# Patient Record
Sex: Male | Born: 1945 | Race: White | Hispanic: No | Marital: Married | Smoking: Never smoker
Health system: Southern US, Community
[De-identification: ages and names within clinical notes are randomized; demographics above are authoritative.]

## PROBLEM LIST (undated history)

## (undated) DIAGNOSIS — C61 Malignant neoplasm of prostate: Secondary | ICD-10-CM

## (undated) HISTORY — PX: PROSTATE BIOPSY: SHX241

---

## 2016-05-22 ENCOUNTER — Other Ambulatory Visit (HOSPITAL_COMMUNITY): Payer: Self-pay | Admitting: Orthopedic Surgery

## 2016-05-22 DIAGNOSIS — R609 Edema, unspecified: Secondary | ICD-10-CM

## 2016-05-24 ENCOUNTER — Ambulatory Visit (HOSPITAL_COMMUNITY)
Admission: RE | Admit: 2016-05-24 | Discharge: 2016-05-24 | Disposition: A | Discharge status: Home / Self Care | Payer: Commercial Managed Care - HMO | Source: Ambulatory Visit | Attending: Orthopedic Surgery | Admitting: Orthopedic Surgery

## 2016-05-24 DIAGNOSIS — R609 Edema, unspecified: Secondary | ICD-10-CM | POA: Diagnosis not present

## 2016-05-24 NOTE — Progress Notes (Signed)
VASCULAR LAB PRELIMINARY  PRELIMINARY  PRELIMINARY  PRELIMINARY  VASCULAR LAB PRELIMINARY RESULTS  Right Upper  Extremity Venous has been completed  Right arm-  No evidence of DVT or superficial thrombosis.    Called office gave results to [facility] @1 .30  Pm.  Bartolo Montanye, RVT, RDMS 05/24/2016, 1:30 PM

## 2020-03-08 ENCOUNTER — Other Ambulatory Visit: Payer: Self-pay | Admitting: Urology

## 2020-03-08 DIAGNOSIS — C61 Malignant neoplasm of prostate: Secondary | ICD-10-CM

## 2020-03-22 ENCOUNTER — Other Ambulatory Visit: Payer: Self-pay

## 2020-03-22 ENCOUNTER — Encounter (HOSPITAL_COMMUNITY)
Admission: RE | Admit: 2020-03-22 | Discharge: 2020-03-22 | Disposition: A | Discharge status: Home / Self Care | Payer: Medicare Other | Source: Ambulatory Visit | Attending: Urology | Admitting: Urology

## 2020-03-22 DIAGNOSIS — C61 Malignant neoplasm of prostate: Secondary | ICD-10-CM | POA: Diagnosis present

## 2020-03-22 MED ORDER — TECHNETIUM TC 99M MEDRONATE IV KIT
21.5000 | PACK | Freq: Once | INTRAVENOUS | Status: AC
  Administered 2020-03-22: 21.5 via INTRAVENOUS

## 2020-04-05 ENCOUNTER — Ambulatory Visit: Payer: PRIVATE HEALTH INSURANCE | Admitting: Radiation Oncology

## 2020-04-06 ENCOUNTER — Telehealth: Payer: Self-pay | Admitting: Radiation Oncology

## 2020-04-06 NOTE — Telephone Encounter (Signed)
-----   Message from Hollace Kinnier sent at 04/05/2020  4:26 PM EDT ----- Regarding: FW: Missed call Hey Sam/Shirley,  I apologize, but I'm not sure who called him. I tried calling him back and Mr. Weninger has questions. He know that he will be receiving a shot in [facility] office, but isn't sure what to expect afterwards. He said that the shot, "would have gold in it". Can someone please assist his questions? He is still good to go on his appt for June 4th.  Thank you,  Cecille Rubin ----- Message ----- From: Heywood Footman, RN Sent: 04/05/2020   3:52 PM EDT To: Hollace Kinnier Subject: Missed call                                    Ms. Cecille Rubin.  Patient left a voicemail message with me that he missed your call. He can be reached at [phone]. Thank you.   Sam

## 2020-04-06 NOTE — Telephone Encounter (Signed)
Patient returned my call. Patient scheduled for initial consult with Dr. Tammi Klippel on 04/16/2020. Confirmed consult appointment time.   Patient states, "Dr. Junious Silk told me y'all would give me a shot." Patient questions what the shot is for. Explained that mostly he was referring to hormone deprivation shot to suppress the testosterone that feeds prostate cancer. Patient questions "after they decide what they are going to do to me how long before we start." Explained a time frame is hard to give without knowing the treatment modality. Patient states, "well I just hope this all doesn't run into November during hunting season."

## 2020-04-06 NOTE — Telephone Encounter (Signed)
Received inbasket message from Reinaldo Berber that the patient has questions. Phoned patient to inquire about his questions. No answer. Left detailed message on his voicemail and provided my direct number. Awaiting return call.

## 2020-04-15 ENCOUNTER — Encounter: Payer: Self-pay | Admitting: Radiation Oncology

## 2020-04-15 NOTE — Progress Notes (Signed)
GU Location of Tumor / Histology: prostatic adenocarcinoma  If Prostate Henderson, Gleason Score is (4 + 5) and PSA is (7.71). Prostate volume: 34 g.  Bruce Henderson had a psa of 4.06 in 2019 that rose in two years to 7.7 prompting a prostate biopsy.    Biopsies of prostate (if applicable) revealed:   Past/Anticipated interventions by urology, if any: prostate biopsy, CT (negative), bone scan (negative), referral for consideration of radiation therapy  Past/Anticipated interventions by medical oncology, if any: no  Weight changes, if any: no  Bowel/Bladder complaints, if any: IPSS 21. SHIM 1. Reports urinary urgency and a weak stream. Reports symptoms improved slightly with flomax. Denies dysuria, hematuria, urinary leakage or incontinence. Denies any bowel complaints.  Nausea/Vomiting, if any: no  Pain issues, if any:  no  SAFETY ISSUES:  Prior radiation? no  Pacemaker/ICD? no  Possible current pregnancy? no, male patient  Is the patient on methotrexate? no  Current Complaints / other details: 74 year old male. Married with one daughter.

## 2020-04-16 ENCOUNTER — Ambulatory Visit
Admission: RE | Admit: 2020-04-16 | Discharge: 2020-04-16 | Disposition: A | Discharge status: Home / Self Care | Payer: Medicare Other | Source: Ambulatory Visit | Attending: Radiation Oncology | Admitting: Radiation Oncology

## 2020-04-16 VITALS — Ht 73.0 in | Wt 220.0 lb

## 2020-04-16 DIAGNOSIS — C61 Malignant neoplasm of prostate: Secondary | ICD-10-CM

## 2020-04-16 HISTORY — DX: Malignant neoplasm of prostate: C61

## 2020-04-16 NOTE — Progress Notes (Signed)
Radiation Oncology         [phone] ________________________________  Initial Outpatient Consultation - Conducted via Telephone due to current COVID-19 concerns for limiting patient exposure  Name: Bruce Henderson MRN: [id number]  Date: 04/16/2020  DOB: [birth year]  IO:NGEX, Bruce Melena, NP  Bruce Aloe, MD   REFERRING PHYSICIAN: Festus Aloe, MD  DIAGNOSIS: 74 y.o. gentleman with Stage T1c adenocarcinoma of the prostate with Gleason score of 4+5, and PSA of 7.7.    ICD-10-CM   1. Malignant neoplasm of prostate ([place])  C61     HISTORY OF PRESENT ILLNESS: Bruce Henderson is a 74 y.o. male with a diagnosis of prostate cancer. He was initially referred to Dr. [name] in 12/2014 for an elevated PSA of 4.37.  Repeat PSA at that appointment had dropped to 3.34 and DRE was normal.  The PSA stabilized to 3.9 in 02/2015 and 07/2015, so the patient opted to have his PSA followed with his primary care provider, Bruce Scales, NP.  More recently, he was noted to have an elevated PSA of 7.71, which was significantly elevated from prior PSA of 4.06 in 02/2018, by his PCP.  Accordingly, he was referred back to Dr. [name] on 01/28/2020,  digital rectal examination was performed at that time revealing right lobe firmness without any discrete nodularity.  The patient proceeded to transrectal ultrasound with 12 biopsies of the prostate on 03/02/2020.  The prostate volume measured 33.75 cc.  Out of 12 core biopsies, 6 were positive.  The maximum Gleason score was 4+5, and this was seen in the left mid and a small focus of Gleason 4+4 was seen in the left apex.  Additionally, Gleason 3+4 was seen in the left apex lateral and Gleason 3+3 in the right mid, right apex (small focus), and right base lateral. No PNI was identified.  He underwent staging CT pelvis on 03/22/2020 showing no lymphadenopathy or other findings suggestive of metastatic disease in the pelvis and no discrete prostatic mass by CT. Bone scan  performed the same day was also negative for bony metastases, showing only degenerative change.   The patient reviewed the biopsy results with his urologist and he has kindly been referred today for discussion of potential radiation treatment options.  PREVIOUS RADIATION THERAPY: No  PAST MEDICAL HISTORY:  Past Medical History:  Diagnosis Date   Prostate cancer ([place])       PAST SURGICAL HISTORY: Past Surgical History:  Procedure Laterality Date   PROSTATE BIOPSY      FAMILY HISTORY:  Family History  Problem Relation Age of Onset   Breast cancer Paternal Aunt    Cancer Maternal Grandfather        unknown   Cancer Paternal Aunt        unknown   Colon cancer Neg Hx    Prostate cancer Neg Hx    Pancreatic cancer Neg Hx     SOCIAL HISTORY:  Social History   Socioeconomic History   Marital status: Married    Spouse name: Not on file   Number of children: 1   Years of education: Not on file   Highest education level: Not on file  Occupational History   Not on file  Tobacco Use   Smoking status: Never Smoker   Smokeless tobacco: Never Used  Substance and Sexual Activity   Alcohol use: Yes    Comment: on occasion he will drink a beer   Drug use: Never   Sexual activity: Not Currently  Other Topics  Concern   Not on file  Social History Narrative   Not on file   Social Determinants of Health   Financial Resource Strain:    Difficulty of Paying Living Expenses:   Food Insecurity:    Worried About Charity fundraiser in the Last Year:    Arboriculturist in the Last Year:   Transportation Needs:    Film/video editor (Medical):    Lack of Transportation (Non-Medical):   Physical Activity:    Days of Exercise per Week:    Minutes of Exercise per Session:   Stress:    Feeling of Stress :   Social Connections:    Frequency of Communication with Friends and Family:    Frequency of Social Gatherings with Friends and Family:     Attends Religious Services:    Active Member of Clubs or Organizations:    Attends Archivist Meetings:    Marital Status:   Intimate Partner Violence:    Fear of Current or Ex-Partner:    Emotionally Abused:    Physically Abused:    Sexually Abused:     ALLERGIES: Patient has no known allergies.  MEDICATIONS:  Current Outpatient Medications  Medication Sig Dispense Refill   allopurinol (ZYLOPRIM) 100 MG tablet      Ascorbic Acid (VITAMIN C) 1000 MG tablet Take 1,000 mg by mouth daily.     atorvastatin (LIPITOR) 20 MG tablet Take 20 mg by mouth daily.     cholecalciferol (VITAMIN D3) 25 MCG (1000 UNIT) tablet Take 1,000 Units by mouth daily.     ezetimibe (ZETIA) 10 MG tablet      metFORMIN (GLUCOPHAGE-XR) 500 MG 24 hr tablet      tamsulosin (FLOMAX) 0.4 MG CAPS capsule      zinc sulfate 220 (50 Zn) MG capsule Take 220 mg by mouth daily.     No current facility-administered medications for this encounter.    REVIEW OF SYSTEMS:  On review of systems, the patient reports that he is doing well overall. He denies any chest pain, shortness of breath, cough, fevers, chills, night sweats, unintended weight changes. He denies any bowel disturbances, and denies abdominal pain, nausea or vomiting. He denies any new musculoskeletal or joint aches or pains. His IPSS was 21, indicating severe urinary symptoms. He reports urinary urgency and weak stream despite taking flomax daily, which has only slightly improved his urinary symptoms. 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 A complete review of systems is obtained and is otherwise negative.    PHYSICAL EXAM:  Wt Readings from Last 3 Encounters:  04/15/20 220 lb (99.8 kg)   Temp Readings from Last 3 Encounters:  No data found for Temp   BP Readings from Last 3 Encounters:  No data found for BP   Pulse Readings from Last 3 Encounters:  No data found for Pulse   Pain Assessment Pain Score:  0-No pain/10  Physical exam not performed in light of telephone consult visit format.   KPS = 90  100 - Normal; no complaints; no evidence of disease. 90   - Able to carry on normal activity; minor signs or symptoms of disease. 80   - Normal activity with effort; some signs or symptoms of disease. 59   - Cares for self; unable to carry on normal activity or to do active work. 60   - Requires occasional assistance, but is able to care for most of his personal needs.  50   - Requires considerable assistance and frequent medical care. 74   - Disabled; requires special care and assistance. 45   - Severely disabled; hospital admission is indicated although death not imminent. 12   - Very sick; hospital admission necessary; active supportive treatment necessary. 10   - Moribund; fatal processes progressing rapidly. 0     - Dead  Karnofsky DA, Abelmann WH, Craver LS and Burchenal JH [phone]) The use of the nitrogen mustards in the palliative treatment of carcinoma: with particular reference to bronchogenic carcinoma Cancer 1 634-56  LABORATORY DATA:  No results found for: WBC, HGB, HCT, MCV, PLT No results found for: NA, K, CL, CO2 No results found for: ALT, AST, GGT, ALKPHOS, BILITOT   RADIOGRAPHY: NM Bone Scan Whole Body  Result Date: 03/22/2020 CLINICAL DATA:  Prostate cancer EXAM: NUCLEAR MEDICINE WHOLE BODY BONE SCAN TECHNIQUE: Whole body anterior and posterior images were obtained approximately 3 hours after intravenous injection of radiopharmaceutical. RADIOPHARMACEUTICALS:  21.5 mCi Technetium-88m MDP IV COMPARISON:  03/22/2020 FINDINGS: Anterior and posterior whole body planar images are obtained after radiotracer administration. There is physiologic excretion of radiotracer within the kidneys and bladder. Activity at the bilateral shoulders, sternoclavicular joints, and right knee most consistent with degenerative change. No focal radiotracer activity to suggest an acute or destructive  process. Specifically, no evidence of bony metastases. IMPRESSION: 1. No evidence of bony metastases. 2. Low level activity of the bilateral shoulders, sternoclavicular joints, and right knee consistent with degenerative change. Electronically Signed   By: Randa Ngo M.D.   On: 03/22/2020 22:27      IMPRESSION/PLAN: This visit was conducted via Telephone to spare the patient unnecessary potential exposure in the healthcare setting during the current COVID-19 pandemic. 1. 74 y.o. gentleman with Stage T1c adenocarcinoma of the prostate with Gleason Score of 4+5, and PSA of 7.7. We discussed the patient's workup and outlined the nature of prostate cancer in this setting. The patient's T stage, Gleason's score, and PSA put him into the high risk group. Accordingly, he is eligible for a variety of potential treatment options including prostatectomy or LT-ADT in combination with either 8 weeks of external radiation or 5 weeks of external radiation preceded by a brachytherapy boost. We discussed the available radiation techniques, and focused on the details and logistics of delivery. The patient is not an ideal candidate for brachytherapy boost with an IPSS score of 21 on medical therapy with Flomax.  Therefore, we discussed and outlined the risks, benefits, short and long-term effects associated with daily external beam radiotherapy and compared and contrasted these with prostatectomy. We discussed the role of SpaceOAR in reducing the rectal toxicity associated with radiotherapy. We also detailed the role of ADT in the treatment of high risk prostate cancer and outlined the associated side effects that could be expected with this therapy.  He and his wife were encouraged to ask questions that were answered to their stated satisfaction.  At the end of the conversation, the patient is interested in moving forward with 8 weeks of external beam therapy in combination with LT-ADT. We will share our discussion with  Dr. [name] and make arrangements for a follow-up visit to start ADT, first available and will also coordinate for fiducial markers and SpaceOAR gel placement, prior to CT simulation in late July or early August 2021, to reduce rectal toxicity from radiotherapy. The patient appears to have a good understanding of his disease and our treatment recommendations which are of curative intent  and is in agreement with the stated plan.  Therefore, we will move forward with treatment planning accordingly, in anticipation of beginning IMRT in August 2021, approximately 2 months after starting ADT.   Given current concerns for patient exposure during the COVID-19 pandemic, this encounter was conducted via telephone. The patient was notified in advance and was offered a MyChart meeting to allow for face to face communication but unfortunately reported that he did not have the appropriate resources/technology to support such a visit and instead preferred to proceed with telephone consult. The patient has given verbal consent for this type of encounter. The time spent during this encounter was 60 minutes. The attendants for this meeting include Tyler Pita MD, Ashlyn Bruning PA-C, [place], and patient Bruce Henderson and his wife. During the encounter, Tyler Pita MD, Ashlyn Bruning PA-C, and scribe, Wilburn Mylar were located at [facility].  Patient Bruce Henderson and his wife were located at home.    Nicholos Johns, PA-C    Tyler Pita, MD  [facility] Direct Dial: [phone]   Fax: [phone] [email or web address]   Skype   [email or web address]  This document serves as a record of services personally performed by Tyler Pita, MD and Freeman Caldron, PA-C. It was created on their behalf by Wilburn Mylar, a trained medical scribe. The creation of this record is based on the scribe's personal observations and the  provider's statements to them. This document has been checked and approved by the attending provider.

## 2020-04-19 ENCOUNTER — Other Ambulatory Visit: Payer: Self-pay

## 2020-04-22 ENCOUNTER — Encounter: Payer: Self-pay | Admitting: Medical Oncology

## 2020-04-22 NOTE — Progress Notes (Signed)
Spoke with patient to introduce myself as the prostate nurse navigator and discuss my role. I was unable to meet him 6/4, when he consulted with Dr. Tammi Klippel and Ailene Ards, PA. He states the consult went very well and he was pleased. He has chosen LT-ADT with 8 weeks of radiation. He is scheduled for ADT 04/28/20 at [facility]. I discussed placement of gold markers/SpaceOar gel and CT simulation. I informed him, Enid Derry will schedule appointments and call him. I gave him my contact information and asked him to call me with questions or concerns. He voiced understanding.

## 2020-04-29 ENCOUNTER — Encounter: Payer: Self-pay | Admitting: Medical Oncology

## 2020-04-29 ENCOUNTER — Telehealth: Payer: Self-pay | Admitting: *Deleted

## 2020-04-29 NOTE — Telephone Encounter (Signed)
Called patient to inform of sim appt. on 06-22-20 - arrival time 10:15 am, spoke with patient and he is aware of this appt.

## 2020-05-05 ENCOUNTER — Telehealth: Payer: Self-pay | Admitting: *Deleted

## 2020-05-05 NOTE — Telephone Encounter (Signed)
RETURNED PATIENT'S PHONE CALL, SPOKE WITH PATIENT. ?

## 2020-05-06 ENCOUNTER — Other Ambulatory Visit: Payer: Self-pay | Admitting: Urology

## 2020-05-06 DIAGNOSIS — C61 Malignant neoplasm of prostate: Secondary | ICD-10-CM

## 2020-06-09 ENCOUNTER — Encounter: Payer: Self-pay | Admitting: Urology

## 2020-06-09 NOTE — Progress Notes (Signed)
Patient will have fiducial markers and SpaceOAR gel placement on 06/10/2020.  CT simulation is scheduled for 06/22/2020.

## 2020-06-16 ENCOUNTER — Telehealth: Payer: Self-pay | Admitting: *Deleted

## 2020-06-16 NOTE — Telephone Encounter (Signed)
Called patient to inform of MRI and sim for 06-22-20, spoke with patient and he is aware of these appts.

## 2020-06-21 ENCOUNTER — Telehealth: Payer: Self-pay | Admitting: *Deleted

## 2020-06-21 NOTE — Telephone Encounter (Signed)
Called patient to remind of MRI and sim for 06-22-20, spoke with patient and he is aware of these appts.

## 2020-06-22 ENCOUNTER — Encounter: Payer: Self-pay | Admitting: Medical Oncology

## 2020-06-22 ENCOUNTER — Ambulatory Visit
Admission: RE | Admit: 2020-06-22 | Discharge: 2020-06-22 | Disposition: A | Discharge status: Home / Self Care | Payer: Medicare Other | Source: Ambulatory Visit | Attending: Radiation Oncology | Admitting: Radiation Oncology

## 2020-06-22 ENCOUNTER — Other Ambulatory Visit: Payer: Self-pay

## 2020-06-22 ENCOUNTER — Ambulatory Visit (HOSPITAL_COMMUNITY)
Admission: RE | Admit: 2020-06-22 | Discharge: 2020-06-22 | Disposition: A | Discharge status: Home / Self Care | Payer: Medicare Other | Source: Ambulatory Visit | Attending: Urology | Admitting: Urology

## 2020-06-22 DIAGNOSIS — C61 Malignant neoplasm of prostate: Secondary | ICD-10-CM | POA: Insufficient documentation

## 2020-06-22 DIAGNOSIS — Z510 Encounter for antineoplastic radiation therapy: Secondary | ICD-10-CM | POA: Diagnosis present

## 2020-06-27 NOTE — Progress Notes (Signed)
  Radiation Oncology         [phone] ________________________________  Name: Bruce Henderson MRN: [id number]  Date: 06/22/2020  DOB: Jun 14, 1946  SIMULATION AND TREATMENT PLANNING NOTE    ICD-10-CM   1. Malignant neoplasm of prostate ([place])  C61     DIAGNOSIS:  74 y.o. gentleman with Stage T1c adenocarcinoma of the prostate with Gleason score of 4+5, and PSA of 7.7  NARRATIVE:  The patient was brought to the [facility].  Identity was confirmed.  All relevant records and images related to the planned course of therapy were reviewed.  The patient freely provided informed written consent to proceed with treatment after reviewing the details related to the planned course of therapy. The consent form was witnessed and verified by the simulation staff.  Then, the patient was set-up in a stable reproducible supine position for radiation therapy.  A vacuum lock pillow device was custom fabricated to position his legs in a reproducible immobilized position.  Then, I performed a urethrogram under sterile conditions to identify the prostatic bed.  CT images were obtained.  Surface markings were placed.  The CT images were loaded into the planning software.  Then the prostate bed target, pelvic lymph node target and avoidance structures including the rectum, bladder, bowel and hips were contoured.  Treatment planning then occurred.  The radiation prescription was entered and confirmed.  A total of one complex treatment devices were fabricated. I have requested : Intensity Modulated Radiotherapy (IMRT) is medically necessary for this case for the following reason:  Rectal sparing.Marland Kitchen  PLAN:  The patient will receive 45 Gy in 25 fractions of 1.8 Gy, followed by a boost to the prostate to a total dose of 75 Gy with 15 additional fractions of 2 Gy.   ________________________________  Sheral Apley Tammi Klippel, M.D.

## 2020-06-30 DIAGNOSIS — Z510 Encounter for antineoplastic radiation therapy: Secondary | ICD-10-CM | POA: Diagnosis not present

## 2020-07-01 ENCOUNTER — Ambulatory Visit
Admission: RE | Admit: 2020-07-01 | Discharge: 2020-07-01 | Disposition: A | Discharge status: Home / Self Care | Payer: Medicare Other | Source: Ambulatory Visit | Attending: Radiation Oncology | Admitting: Radiation Oncology

## 2020-07-01 ENCOUNTER — Other Ambulatory Visit: Payer: Self-pay

## 2020-07-01 DIAGNOSIS — Z510 Encounter for antineoplastic radiation therapy: Secondary | ICD-10-CM | POA: Diagnosis not present

## 2020-07-02 ENCOUNTER — Other Ambulatory Visit: Payer: Self-pay

## 2020-07-02 ENCOUNTER — Ambulatory Visit
Admission: RE | Admit: 2020-07-02 | Discharge: 2020-07-02 | Disposition: A | Discharge status: Home / Self Care | Payer: Medicare Other | Source: Ambulatory Visit | Attending: Radiation Oncology | Admitting: Radiation Oncology

## 2020-07-02 DIAGNOSIS — Z510 Encounter for antineoplastic radiation therapy: Secondary | ICD-10-CM | POA: Diagnosis not present

## 2020-07-02 NOTE — Progress Notes (Signed)
Complete post sim education with patient today. Oriented patient to staff and routine of the clinic. Provided patient with RADIATION THERAPY AND YOU handbook then, reviewed pertinent information. Educated patient reference potential side effects and management such as fatigue, diarrhea and urinary/bladder changes. Answered all patient questions to the best of my ability. Provided patient with my business card and encouraged him to call with future needs. Patient verbalized understanding of all reviewed.  

## 2020-07-05 ENCOUNTER — Ambulatory Visit
Admission: RE | Admit: 2020-07-05 | Discharge: 2020-07-05 | Disposition: A | Discharge status: Home / Self Care | Payer: Medicare Other | Source: Ambulatory Visit | Attending: Radiation Oncology | Admitting: Radiation Oncology

## 2020-07-05 ENCOUNTER — Other Ambulatory Visit: Payer: Self-pay

## 2020-07-05 DIAGNOSIS — Z510 Encounter for antineoplastic radiation therapy: Secondary | ICD-10-CM | POA: Diagnosis not present

## 2020-07-06 ENCOUNTER — Other Ambulatory Visit: Payer: Self-pay

## 2020-07-06 ENCOUNTER — Ambulatory Visit
Admission: RE | Admit: 2020-07-06 | Discharge: 2020-07-06 | Disposition: A | Discharge status: Home / Self Care | Payer: Medicare Other | Source: Ambulatory Visit | Attending: Radiation Oncology | Admitting: Radiation Oncology

## 2020-07-06 DIAGNOSIS — Z510 Encounter for antineoplastic radiation therapy: Secondary | ICD-10-CM | POA: Diagnosis not present

## 2020-07-07 ENCOUNTER — Ambulatory Visit
Admission: RE | Admit: 2020-07-07 | Discharge: 2020-07-07 | Disposition: A | Discharge status: Home / Self Care | Payer: Medicare Other | Source: Ambulatory Visit | Attending: Radiation Oncology | Admitting: Radiation Oncology

## 2020-07-07 ENCOUNTER — Other Ambulatory Visit: Payer: Self-pay

## 2020-07-07 DIAGNOSIS — Z510 Encounter for antineoplastic radiation therapy: Secondary | ICD-10-CM | POA: Diagnosis not present

## 2020-07-08 ENCOUNTER — Other Ambulatory Visit: Payer: Self-pay

## 2020-07-08 ENCOUNTER — Ambulatory Visit
Admission: RE | Admit: 2020-07-08 | Discharge: 2020-07-08 | Disposition: A | Discharge status: Home / Self Care | Payer: Medicare Other | Source: Ambulatory Visit | Attending: Radiation Oncology | Admitting: Radiation Oncology

## 2020-07-08 DIAGNOSIS — Z510 Encounter for antineoplastic radiation therapy: Secondary | ICD-10-CM | POA: Diagnosis not present

## 2020-07-09 ENCOUNTER — Ambulatory Visit
Admission: RE | Admit: 2020-07-09 | Discharge: 2020-07-09 | Disposition: A | Discharge status: Home / Self Care | Payer: Medicare Other | Source: Ambulatory Visit | Attending: Radiation Oncology | Admitting: Radiation Oncology

## 2020-07-09 ENCOUNTER — Other Ambulatory Visit: Payer: Self-pay

## 2020-07-09 DIAGNOSIS — Z510 Encounter for antineoplastic radiation therapy: Secondary | ICD-10-CM | POA: Diagnosis not present

## 2020-07-12 ENCOUNTER — Other Ambulatory Visit: Payer: Self-pay

## 2020-07-12 ENCOUNTER — Ambulatory Visit
Admission: RE | Admit: 2020-07-12 | Discharge: 2020-07-12 | Disposition: A | Discharge status: Home / Self Care | Payer: Medicare Other | Source: Ambulatory Visit | Attending: Radiation Oncology | Admitting: Radiation Oncology

## 2020-07-12 DIAGNOSIS — Z510 Encounter for antineoplastic radiation therapy: Secondary | ICD-10-CM | POA: Diagnosis not present

## 2020-07-13 ENCOUNTER — Other Ambulatory Visit: Payer: Self-pay

## 2020-07-13 ENCOUNTER — Ambulatory Visit
Admission: RE | Admit: 2020-07-13 | Discharge: 2020-07-13 | Disposition: A | Discharge status: Home / Self Care | Payer: Medicare Other | Source: Ambulatory Visit | Attending: Radiation Oncology | Admitting: Radiation Oncology

## 2020-07-13 DIAGNOSIS — Z510 Encounter for antineoplastic radiation therapy: Secondary | ICD-10-CM | POA: Diagnosis not present

## 2020-07-14 ENCOUNTER — Ambulatory Visit
Admission: RE | Admit: 2020-07-14 | Discharge: 2020-07-14 | Disposition: A | Discharge status: Home / Self Care | Payer: Medicare Other | Source: Ambulatory Visit | Attending: Radiation Oncology | Admitting: Radiation Oncology

## 2020-07-14 ENCOUNTER — Other Ambulatory Visit: Payer: Self-pay

## 2020-07-14 DIAGNOSIS — C61 Malignant neoplasm of prostate: Secondary | ICD-10-CM | POA: Diagnosis present

## 2020-07-14 DIAGNOSIS — Z510 Encounter for antineoplastic radiation therapy: Secondary | ICD-10-CM | POA: Diagnosis present

## 2020-07-15 ENCOUNTER — Other Ambulatory Visit: Payer: Self-pay

## 2020-07-15 ENCOUNTER — Ambulatory Visit
Admission: RE | Admit: 2020-07-15 | Discharge: 2020-07-15 | Disposition: A | Discharge status: Home / Self Care | Payer: Medicare Other | Source: Ambulatory Visit | Attending: Radiation Oncology | Admitting: Radiation Oncology

## 2020-07-15 ENCOUNTER — Encounter: Payer: Self-pay | Admitting: Medical Oncology

## 2020-07-15 DIAGNOSIS — Z510 Encounter for antineoplastic radiation therapy: Secondary | ICD-10-CM | POA: Diagnosis not present

## 2020-07-16 ENCOUNTER — Ambulatory Visit
Admission: RE | Admit: 2020-07-16 | Discharge: 2020-07-16 | Disposition: A | Discharge status: Home / Self Care | Payer: Medicare Other | Source: Ambulatory Visit | Attending: Radiation Oncology | Admitting: Radiation Oncology

## 2020-07-16 ENCOUNTER — Other Ambulatory Visit: Payer: Self-pay

## 2020-07-16 DIAGNOSIS — Z510 Encounter for antineoplastic radiation therapy: Secondary | ICD-10-CM | POA: Diagnosis not present

## 2020-07-20 ENCOUNTER — Other Ambulatory Visit: Payer: Self-pay

## 2020-07-20 ENCOUNTER — Telehealth: Payer: Self-pay | Admitting: Radiation Oncology

## 2020-07-20 ENCOUNTER — Ambulatory Visit
Admission: RE | Admit: 2020-07-20 | Discharge: 2020-07-20 | Disposition: A | Discharge status: Home / Self Care | Payer: Medicare Other | Source: Ambulatory Visit | Attending: Radiation Oncology | Admitting: Radiation Oncology

## 2020-07-20 DIAGNOSIS — Z510 Encounter for antineoplastic radiation therapy: Secondary | ICD-10-CM | POA: Diagnosis not present

## 2020-07-20 NOTE — Telephone Encounter (Signed)
Phoned patient. Explained the [place] paperwork he inquired about Friday is MIA. Requested he bring another copy in. Patient agreed. Discuss diet moving forward and Imodium. Answered all questions to the best of my ability. Patient verbalized understanding of all reviewed and voiced appreciation for the call.

## 2020-07-20 NOTE — Telephone Encounter (Signed)
-----   Message from Gwenette Greet, RN sent at 07/20/2020  9:06 AM EDT ----- Regarding: RE: Agent orange/[place] No I have not.  Thanks,  RB ----- Message ----- From: Heywood Footman, RN Sent: 07/16/2020   1:16 PM EDT To: Gwenette Greet, RN Subject: Agent orange/[place]                                Thurnell Garbe.   Have you seen any paperwork from the [place] reference this gentleman and agent orange exposure? If not I need to ask him to bring in another copy. Thanks.    Sam

## 2020-07-21 ENCOUNTER — Other Ambulatory Visit: Payer: Self-pay

## 2020-07-21 ENCOUNTER — Ambulatory Visit
Admission: RE | Admit: 2020-07-21 | Discharge: 2020-07-21 | Disposition: A | Discharge status: Home / Self Care | Payer: Medicare Other | Source: Ambulatory Visit | Attending: Radiation Oncology | Admitting: Radiation Oncology

## 2020-07-21 DIAGNOSIS — Z510 Encounter for antineoplastic radiation therapy: Secondary | ICD-10-CM | POA: Diagnosis not present

## 2020-07-22 ENCOUNTER — Telehealth: Payer: Self-pay | Admitting: Radiation Oncology

## 2020-07-22 ENCOUNTER — Other Ambulatory Visit: Payer: Self-pay

## 2020-07-22 ENCOUNTER — Ambulatory Visit
Admission: RE | Admit: 2020-07-22 | Discharge: 2020-07-22 | Disposition: A | Discharge status: Home / Self Care | Payer: Medicare Other | Source: Ambulatory Visit | Attending: Radiation Oncology | Admitting: Radiation Oncology

## 2020-07-22 DIAGNOSIS — Z510 Encounter for antineoplastic radiation therapy: Secondary | ICD-10-CM | POA: Diagnosis not present

## 2020-07-22 NOTE — Telephone Encounter (Signed)
Received voicemail message from patient requesting return call. Phoned patient to inquire. Patient reports he was at a meeting last night and another gentleman in attendance was diagnosed with COVID today. Patient confirms he wore his mask the entire meeting and remained six feet from all in attendance. Patient denies any covid like symptoms. Assured patient he can resume radiation treatment. Instructed patient should symptoms develop he should contact this RN and not present for treatment. Patient verbalized understanding of all reviewed.

## 2020-07-23 ENCOUNTER — Ambulatory Visit
Admission: RE | Admit: 2020-07-23 | Discharge: 2020-07-23 | Disposition: A | Discharge status: Home / Self Care | Payer: Medicare Other | Source: Ambulatory Visit | Attending: Radiation Oncology | Admitting: Radiation Oncology

## 2020-07-23 ENCOUNTER — Other Ambulatory Visit: Payer: Self-pay

## 2020-07-23 DIAGNOSIS — Z510 Encounter for antineoplastic radiation therapy: Secondary | ICD-10-CM | POA: Diagnosis not present

## 2020-07-26 ENCOUNTER — Ambulatory Visit
Admission: RE | Admit: 2020-07-26 | Discharge: 2020-07-26 | Disposition: A | Discharge status: Home / Self Care | Payer: Medicare Other | Source: Ambulatory Visit | Attending: Radiation Oncology | Admitting: Radiation Oncology

## 2020-07-26 ENCOUNTER — Other Ambulatory Visit: Payer: Self-pay

## 2020-07-26 DIAGNOSIS — Z510 Encounter for antineoplastic radiation therapy: Secondary | ICD-10-CM | POA: Diagnosis not present

## 2020-07-27 ENCOUNTER — Other Ambulatory Visit: Payer: Self-pay

## 2020-07-27 ENCOUNTER — Ambulatory Visit
Admission: RE | Admit: 2020-07-27 | Discharge: 2020-07-27 | Disposition: A | Discharge status: Home / Self Care | Payer: Medicare Other | Source: Ambulatory Visit | Attending: Radiation Oncology | Admitting: Radiation Oncology

## 2020-07-27 DIAGNOSIS — Z510 Encounter for antineoplastic radiation therapy: Secondary | ICD-10-CM | POA: Diagnosis not present

## 2020-07-28 ENCOUNTER — Ambulatory Visit
Admission: RE | Admit: 2020-07-28 | Discharge: 2020-07-28 | Disposition: A | Discharge status: Home / Self Care | Payer: Medicare Other | Source: Ambulatory Visit | Attending: Radiation Oncology | Admitting: Radiation Oncology

## 2020-07-28 ENCOUNTER — Other Ambulatory Visit: Payer: Self-pay

## 2020-07-28 DIAGNOSIS — Z510 Encounter for antineoplastic radiation therapy: Secondary | ICD-10-CM | POA: Diagnosis not present

## 2020-07-29 ENCOUNTER — Ambulatory Visit
Admission: RE | Admit: 2020-07-29 | Discharge: 2020-07-29 | Disposition: A | Discharge status: Home / Self Care | Payer: Medicare Other | Source: Ambulatory Visit | Attending: Radiation Oncology | Admitting: Radiation Oncology

## 2020-07-29 ENCOUNTER — Other Ambulatory Visit: Payer: Self-pay

## 2020-07-29 DIAGNOSIS — Z510 Encounter for antineoplastic radiation therapy: Secondary | ICD-10-CM | POA: Diagnosis not present

## 2020-07-30 ENCOUNTER — Ambulatory Visit
Admission: RE | Admit: 2020-07-30 | Discharge: 2020-07-30 | Disposition: A | Discharge status: Home / Self Care | Payer: Medicare Other | Source: Ambulatory Visit | Attending: Radiation Oncology | Admitting: Radiation Oncology

## 2020-07-30 ENCOUNTER — Other Ambulatory Visit: Payer: Self-pay

## 2020-07-30 DIAGNOSIS — Z510 Encounter for antineoplastic radiation therapy: Secondary | ICD-10-CM | POA: Diagnosis not present

## 2020-08-02 ENCOUNTER — Ambulatory Visit
Admission: RE | Admit: 2020-08-02 | Discharge: 2020-08-02 | Disposition: A | Discharge status: Home / Self Care | Payer: Medicare Other | Source: Ambulatory Visit | Attending: Radiation Oncology | Admitting: Radiation Oncology

## 2020-08-02 DIAGNOSIS — Z510 Encounter for antineoplastic radiation therapy: Secondary | ICD-10-CM | POA: Diagnosis not present

## 2020-08-03 ENCOUNTER — Other Ambulatory Visit: Payer: Self-pay

## 2020-08-03 ENCOUNTER — Ambulatory Visit
Admission: RE | Admit: 2020-08-03 | Discharge: 2020-08-03 | Disposition: A | Discharge status: Home / Self Care | Payer: Medicare Other | Source: Ambulatory Visit | Attending: Radiation Oncology | Admitting: Radiation Oncology

## 2020-08-03 DIAGNOSIS — Z510 Encounter for antineoplastic radiation therapy: Secondary | ICD-10-CM | POA: Diagnosis not present

## 2020-08-04 ENCOUNTER — Ambulatory Visit
Admission: RE | Admit: 2020-08-04 | Discharge: 2020-08-04 | Disposition: A | Discharge status: Home / Self Care | Payer: Medicare Other | Source: Ambulatory Visit | Attending: Radiation Oncology | Admitting: Radiation Oncology

## 2020-08-04 DIAGNOSIS — Z510 Encounter for antineoplastic radiation therapy: Secondary | ICD-10-CM | POA: Diagnosis not present

## 2020-08-05 ENCOUNTER — Other Ambulatory Visit: Payer: Self-pay

## 2020-08-05 ENCOUNTER — Ambulatory Visit
Admission: RE | Admit: 2020-08-05 | Discharge: 2020-08-05 | Disposition: A | Discharge status: Home / Self Care | Payer: Medicare Other | Source: Ambulatory Visit | Attending: Radiation Oncology | Admitting: Radiation Oncology

## 2020-08-05 DIAGNOSIS — Z510 Encounter for antineoplastic radiation therapy: Secondary | ICD-10-CM | POA: Diagnosis not present

## 2020-08-06 ENCOUNTER — Other Ambulatory Visit: Payer: Self-pay

## 2020-08-06 ENCOUNTER — Ambulatory Visit
Admission: RE | Admit: 2020-08-06 | Discharge: 2020-08-06 | Disposition: A | Discharge status: Home / Self Care | Payer: Medicare Other | Source: Ambulatory Visit | Attending: Radiation Oncology | Admitting: Radiation Oncology

## 2020-08-06 DIAGNOSIS — Z510 Encounter for antineoplastic radiation therapy: Secondary | ICD-10-CM | POA: Diagnosis not present

## 2020-08-09 ENCOUNTER — Ambulatory Visit
Admission: RE | Admit: 2020-08-09 | Discharge: 2020-08-09 | Disposition: A | Discharge status: Home / Self Care | Payer: Medicare Other | Source: Ambulatory Visit | Attending: Radiation Oncology | Admitting: Radiation Oncology

## 2020-08-09 ENCOUNTER — Other Ambulatory Visit: Payer: Self-pay

## 2020-08-09 DIAGNOSIS — Z510 Encounter for antineoplastic radiation therapy: Secondary | ICD-10-CM | POA: Diagnosis not present

## 2020-08-10 ENCOUNTER — Other Ambulatory Visit: Payer: Self-pay

## 2020-08-10 ENCOUNTER — Ambulatory Visit
Admission: RE | Admit: 2020-08-10 | Discharge: 2020-08-10 | Disposition: A | Discharge status: Home / Self Care | Payer: Medicare Other | Source: Ambulatory Visit | Attending: Radiation Oncology | Admitting: Radiation Oncology

## 2020-08-10 DIAGNOSIS — Z510 Encounter for antineoplastic radiation therapy: Secondary | ICD-10-CM | POA: Diagnosis not present

## 2020-08-11 ENCOUNTER — Ambulatory Visit
Admission: RE | Admit: 2020-08-11 | Discharge: 2020-08-11 | Disposition: A | Discharge status: Home / Self Care | Payer: Medicare Other | Source: Ambulatory Visit | Attending: Radiation Oncology | Admitting: Radiation Oncology

## 2020-08-11 ENCOUNTER — Other Ambulatory Visit: Payer: Self-pay

## 2020-08-11 DIAGNOSIS — Z510 Encounter for antineoplastic radiation therapy: Secondary | ICD-10-CM | POA: Diagnosis not present

## 2020-08-12 ENCOUNTER — Ambulatory Visit
Admission: RE | Admit: 2020-08-12 | Discharge: 2020-08-12 | Disposition: A | Discharge status: Home / Self Care | Payer: Medicare Other | Source: Ambulatory Visit | Attending: Radiation Oncology | Admitting: Radiation Oncology

## 2020-08-12 DIAGNOSIS — Z510 Encounter for antineoplastic radiation therapy: Secondary | ICD-10-CM | POA: Diagnosis not present

## 2020-08-13 ENCOUNTER — Other Ambulatory Visit: Payer: Self-pay

## 2020-08-13 ENCOUNTER — Ambulatory Visit
Admission: RE | Admit: 2020-08-13 | Discharge: 2020-08-13 | Disposition: A | Discharge status: Home / Self Care | Payer: Medicare Other | Source: Ambulatory Visit | Attending: Radiation Oncology | Admitting: Radiation Oncology

## 2020-08-13 DIAGNOSIS — C61 Malignant neoplasm of prostate: Secondary | ICD-10-CM | POA: Diagnosis present

## 2020-08-13 DIAGNOSIS — Z510 Encounter for antineoplastic radiation therapy: Secondary | ICD-10-CM | POA: Diagnosis present

## 2020-08-16 ENCOUNTER — Ambulatory Visit
Admission: RE | Admit: 2020-08-16 | Discharge: 2020-08-16 | Disposition: A | Discharge status: Home / Self Care | Payer: Medicare Other | Source: Ambulatory Visit | Attending: Radiation Oncology | Admitting: Radiation Oncology

## 2020-08-16 ENCOUNTER — Other Ambulatory Visit: Payer: Self-pay

## 2020-08-16 DIAGNOSIS — Z510 Encounter for antineoplastic radiation therapy: Secondary | ICD-10-CM | POA: Diagnosis not present

## 2020-08-17 ENCOUNTER — Ambulatory Visit
Admission: RE | Admit: 2020-08-17 | Discharge: 2020-08-17 | Disposition: A | Discharge status: Home / Self Care | Payer: Medicare Other | Source: Ambulatory Visit | Attending: Radiation Oncology | Admitting: Radiation Oncology

## 2020-08-17 DIAGNOSIS — Z510 Encounter for antineoplastic radiation therapy: Secondary | ICD-10-CM | POA: Diagnosis not present

## 2020-08-18 ENCOUNTER — Ambulatory Visit
Admission: RE | Admit: 2020-08-18 | Discharge: 2020-08-18 | Disposition: A | Discharge status: Home / Self Care | Payer: Medicare Other | Source: Ambulatory Visit | Attending: Radiation Oncology | Admitting: Radiation Oncology

## 2020-08-18 DIAGNOSIS — Z510 Encounter for antineoplastic radiation therapy: Secondary | ICD-10-CM | POA: Diagnosis not present

## 2020-08-19 ENCOUNTER — Ambulatory Visit
Admission: RE | Admit: 2020-08-19 | Discharge: 2020-08-19 | Disposition: A | Discharge status: Home / Self Care | Payer: Medicare Other | Source: Ambulatory Visit | Attending: Radiation Oncology | Admitting: Radiation Oncology

## 2020-08-19 DIAGNOSIS — Z510 Encounter for antineoplastic radiation therapy: Secondary | ICD-10-CM | POA: Diagnosis not present

## 2020-08-20 ENCOUNTER — Ambulatory Visit
Admission: RE | Admit: 2020-08-20 | Discharge: 2020-08-20 | Disposition: A | Discharge status: Home / Self Care | Payer: Medicare Other | Source: Ambulatory Visit | Attending: Radiation Oncology | Admitting: Radiation Oncology

## 2020-08-20 DIAGNOSIS — Z510 Encounter for antineoplastic radiation therapy: Secondary | ICD-10-CM | POA: Diagnosis not present

## 2020-08-23 ENCOUNTER — Ambulatory Visit
Admission: RE | Admit: 2020-08-23 | Discharge: 2020-08-23 | Disposition: A | Discharge status: Home / Self Care | Payer: Medicare Other | Source: Ambulatory Visit | Attending: Radiation Oncology | Admitting: Radiation Oncology

## 2020-08-23 DIAGNOSIS — Z510 Encounter for antineoplastic radiation therapy: Secondary | ICD-10-CM | POA: Diagnosis not present

## 2020-08-24 ENCOUNTER — Ambulatory Visit
Admission: RE | Admit: 2020-08-24 | Discharge: 2020-08-24 | Disposition: A | Discharge status: Home / Self Care | Payer: Medicare Other | Source: Ambulatory Visit | Attending: Radiation Oncology | Admitting: Radiation Oncology

## 2020-08-24 DIAGNOSIS — Z510 Encounter for antineoplastic radiation therapy: Secondary | ICD-10-CM | POA: Diagnosis not present

## 2020-08-25 ENCOUNTER — Ambulatory Visit
Admission: RE | Admit: 2020-08-25 | Discharge: 2020-08-25 | Disposition: A | Discharge status: Home / Self Care | Payer: Medicare Other | Source: Ambulatory Visit | Attending: Radiation Oncology | Admitting: Radiation Oncology

## 2020-08-25 DIAGNOSIS — Z510 Encounter for antineoplastic radiation therapy: Secondary | ICD-10-CM | POA: Diagnosis not present

## 2020-08-26 ENCOUNTER — Ambulatory Visit
Admission: RE | Admit: 2020-08-26 | Discharge: 2020-08-26 | Disposition: A | Discharge status: Home / Self Care | Payer: Medicare Other | Source: Ambulatory Visit | Attending: Radiation Oncology | Admitting: Radiation Oncology

## 2020-08-26 ENCOUNTER — Telehealth: Payer: Self-pay | Admitting: Radiation Oncology

## 2020-08-26 ENCOUNTER — Encounter: Payer: Self-pay | Admitting: Urology

## 2020-08-26 DIAGNOSIS — Z510 Encounter for antineoplastic radiation therapy: Secondary | ICD-10-CM | POA: Diagnosis not present

## 2020-08-26 NOTE — Telephone Encounter (Signed)
Patient completed prostate radiation today. Received voicemail message from patient later in the day inquiring about diet. Phoned patient back. No answer. Left voicemail message explaining he should continue the low fiber bland diet for the next two weeks then slowly start reintroducing those restricted foods he enjoys. Provided direct number for further questions.

## 2020-08-27 ENCOUNTER — Encounter: Payer: Self-pay | Admitting: Medical Oncology

## 2020-08-27 NOTE — Progress Notes (Signed)
Called patient with follow up appointment with Dr. Junious Silk 12/16@ 8:15am. He completed radiation yesterday and expressed his apprecation for the excellent care he received. He confirmed his follow up appointment with Ashlyn, Meyer on   11/18 @ 8:30 am.

## 2020-09-29 ENCOUNTER — Other Ambulatory Visit: Payer: Self-pay

## 2020-09-29 ENCOUNTER — Encounter: Payer: Self-pay | Admitting: Urology

## 2020-09-29 NOTE — Progress Notes (Addendum)
  Patient states that he has dysuria sometime. Denies any hematuria .Reports  Nocturia x  4-5.Patient reports some urgency . Patient reports a moderate to weak stream.Pateint denies having to strain to start his stream.  Patient states that he has some diarrhea ,but he is using imodium for relief.Patient reports some urgency with urination. Patient states that he has an appointment with the urologist  On Dec 18 th or 19th. Patients IPPS score is 17. Patients meaningful use is complete.Patietn is aware that this is a phone visit.

## 2020-09-30 ENCOUNTER — Ambulatory Visit
Admission: RE | Admit: 2020-09-30 | Discharge: 2020-09-30 | Disposition: A | Discharge status: Home / Self Care | Payer: Medicare Other | Source: Ambulatory Visit | Attending: Urology | Admitting: Urology

## 2020-09-30 DIAGNOSIS — C61 Malignant neoplasm of prostate: Secondary | ICD-10-CM

## 2020-09-30 NOTE — Progress Notes (Signed)
Radiation Oncology         ([phone]) [phone] ________________________________  Name: Bruce Henderson MRN: [id number]  Date: 09/30/2020  DOB: 08-May-1946  Post Treatment Note  CC: Imagene Riches, NP  Imagene Riches, NP  Diagnosis:   74 y.o. gentleman with Stage T1c adenocarcinoma of the prostate with Gleason Score of 4+5, and PSA of 7.7.  Interval Since Last Radiation:  5 weeks  07/01/20 - 08/26/20; concurrent with LT-ADT (started 04/28/20): 1. The prostate, seminal vesicles, and pelvic lymph nodes were initially treated to 45 Gy in 25 fractions of 1.8 Gy  2. The prostate only was boosted to 75 Gy with 15 additional fractions of 2.0 Gy    Narrative:  I spoke with the patient to conduct his routine scheduled 1 month follow up visit via telephone to spare the patient unnecessary potential exposure in the healthcare setting during the current COVID-19 pandemic.  The patient was notified in advance and gave permission to proceed with this visit format. He tolerated radiation treatment relatively well with only minor urinary irritation and modest fatigue.  He did report increased frequency, urgency, incomplete emptying and nocturia every 1-2 hours throughout the course of treatment.  He denied any dysuria or gross hematuria.  He did also experience occasional diarrhea which was managed with Imodium as needed.                              On review of systems, the patient states that he is doing well in general.  He continues with nocturia 4-5 times per night, increased urgency, weaker flow of stream, hesitancy and occasional diarrhea.  His current IPSS score is 17 indicating moderate to severe urinary symptoms despite taking Flomax as prescribed.  He does feel like his LUTS are gradually improving and are manageable at this point.  He denies abdominal pain, nausea, vomiting or constipation.  He reports a healthy appetite and is maintaining his weight.  He continues with modest fatigue and occasional hot  flashes associated with his ADT but otherwise feels he is tolerating this well.  Overall, he is quite pleased with his progress to date.  ALLERGIES:  has No Known Allergies.  Meds: Current Outpatient Medications  Medication Sig Dispense Refill  . allopurinol (ZYLOPRIM) 100 MG tablet     . atorvastatin (LIPITOR) 20 MG tablet Take 20 mg by mouth daily.    . indomethacin (INDOCIN) 50 MG capsule Take 50 mg by mouth as needed.    . loperamide (IMODIUM) 2 MG capsule Take 2 mg by mouth as needed for diarrhea or loose stools.    . metFORMIN (GLUCOPHAGE-XR) 500 MG 24 hr tablet     . Ascorbic Acid (VITAMIN C) 1000 MG tablet Take 1,000 mg by mouth daily. (Patient not taking: Reported on 09/29/2020)    . cholecalciferol (VITAMIN D3) 25 MCG (1000 UNIT) tablet Take 1,000 Units by mouth daily. (Patient not taking: Reported on 09/29/2020)    . ezetimibe (ZETIA) 10 MG tablet  (Patient not taking: Reported on 09/29/2020)    . tamsulosin (FLOMAX) 0.4 MG CAPS capsule  (Patient not taking: Reported on 09/29/2020)    . zinc sulfate 220 (50 Zn) MG capsule Take 220 mg by mouth daily. (Patient not taking: Reported on 09/29/2020)     No current facility-administered medications for this encounter.    Physical Findings:  vitals were not taken for this visit.   /Unable to assess due to  telephone follow-up visit format.  Lab Findings: No results found for: WBC, HGB, HCT, MCV, PLT   Radiographic Findings: No results found.  Impression/Plan: 1. 74 y.o. gentleman with Stage T1c adenocarcinoma of the prostate with Gleason Score of 4+5, and PSA of 7.7. He will continue to follow up with urology for ongoing PSA determinations and has an appointment scheduled with Dr. Junious Silk on 10/28/2020 at 8:15 AM. He understands what to expect with regards to PSA monitoring going forward. I will look forward to following his response to treatment via correspondence with urology, and would be happy to continue to participate in  his care if clinically indicated. I talked to the patient about what to expect in the future, including his risk for erectile dysfunction and rectal bleeding. I encouraged him to call or return to the office if he has any questions regarding his previous radiation or possible radiation side effects. He was comfortable with this plan and will follow up as needed.    Nicholos Johns, PA-C

## 2020-09-30 NOTE — Progress Notes (Signed)
  Radiation Oncology         [phone] ________________________________  Name: Bruce Henderson MRN: [id number]  Date: 08/26/2020  DOB: [birth year]  End of Treatment Note  Diagnosis:   74 y.o. gentleman with Stage T1c adenocarcinoma of the prostate with Gleason Score of 4+5, and PSA of 7.7.     Indication for treatment:  Curative, Definitive Radiotherapy       Radiation treatment dates:   07/01/20 - 08/26/20; concurrent with LT-ADT (started 04/28/20)  Site/dose:  1. The prostate, seminal vesicles, and pelvic lymph nodes were initially treated to 45 Gy in 25 fractions of 1.8 Gy  2. The prostate only was boosted to 75 Gy with 15 additional fractions of 2.0 Gy   Beams/energy:  1. The prostate, seminal vesicles, and pelvic lymph nodes were initially treated using VMAT intensity modulated radiotherapy delivering 6 megavolt photons. Image guidance was performed with CB-CT studies prior to each fraction. He was immobilized with a body fix lower extremity mold.  2. the prostate only was boosted using VMAT intensity modulated radiotherapy delivering 6 megavolt photons. Image guidance was performed with CB-CT studies prior to each fraction. He was immobilized with a body fix lower extremity mold.  Narrative: The patient tolerated radiation treatment relatively well with only minor urinary irritation and modest fatigue.  He did report increased frequency, urgency, incomplete emptying and nocturia every 1-2 hours throughout the course of treatment.  He denied any dysuria or gross hematuria.  He did also experience occasional diarrhea which was managed with Imodium as needed.  Plan: The patient has completed radiation treatment. He will return to radiation oncology clinic for routine followup in one month. I advised him to call or return sooner if he has any questions or concerns related to his recovery or treatment. ________________________________  Sheral Apley. Tammi Klippel, M.D.

## 2021-04-22 IMAGING — NM NM BONE WHOLE BODY
2 series · 2 of 2 positions shown · non-contrast
Comparison: 03/22/2020

CLINICAL DATA: Prostate cancer

EXAM:
NUCLEAR MEDICINE WHOLE BODY BONE SCAN
TECHNIQUE: Whole body anterior and posterior images were obtained approximately
3 hours after intravenous injection of radiopharmaceutical.
RADIOPHARMACEUTICALS:  21.5 mCi Cechnetium-WWm MDP IV

[Series 1: whole body · 2.66mm/px · 1 of 1 slices shown (1 of 2)]
[im 1/1]
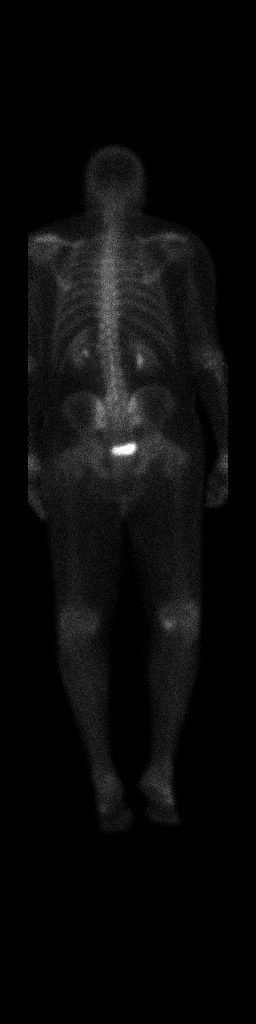

[Series 1: whole body · 2.66mm/px · 1 of 1 slices shown (2 of 2)]
[im 1/1]
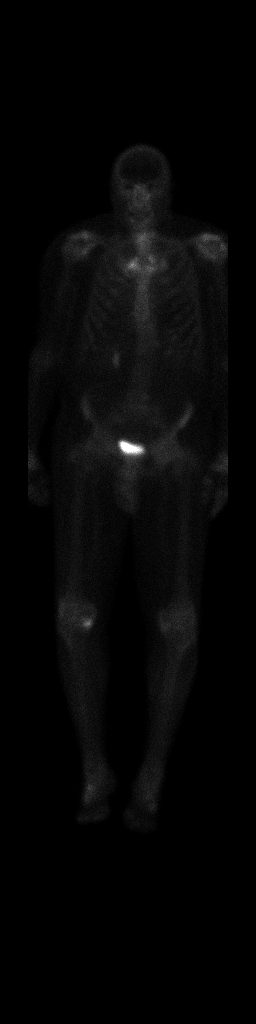

[2 of 2 positions shown; findings below may reference images not displayed]

FINDINGS: Anterior and posterior whole body planar images are obtained after
radiotracer administration. There is physiologic excretion of
radiotracer within the kidneys and bladder.

Activity at the bilateral shoulders, sternoclavicular joints, and
right knee most consistent with degenerative change.

No focal radiotracer activity to suggest an acute or destructive
process. Specifically, no evidence of bony metastases.
IMPRESSION: 1. No evidence of bony metastases.
2. Low level activity of the bilateral shoulders, sternoclavicular
joints, and right knee consistent with degenerative change.
